# Patient Record
Sex: Male | Born: 1950 | Race: White | Hispanic: No | Marital: Married | Smoking: Never smoker
Health system: Southern US, Community
[De-identification: ages and names within clinical notes are randomized; demographics above are authoritative.]

## PROBLEM LIST (undated history)

## (undated) DIAGNOSIS — I10 Essential (primary) hypertension: Secondary | ICD-10-CM

## (undated) HISTORY — PX: HERNIA REPAIR: SHX51

---

## 2021-02-06 ENCOUNTER — Emergency Department (HOSPITAL_BASED_OUTPATIENT_CLINIC_OR_DEPARTMENT_OTHER)
Admission: EM | Admit: 2021-02-06 | Discharge: 2021-02-07 | Disposition: A | Discharge status: Home / Self Care | Payer: Medicare Other | Attending: Emergency Medicine | Admitting: Emergency Medicine

## 2021-02-06 ENCOUNTER — Other Ambulatory Visit: Payer: Self-pay

## 2021-02-06 ENCOUNTER — Encounter (HOSPITAL_BASED_OUTPATIENT_CLINIC_OR_DEPARTMENT_OTHER): Payer: Self-pay | Admitting: Emergency Medicine

## 2021-02-06 ENCOUNTER — Emergency Department (HOSPITAL_BASED_OUTPATIENT_CLINIC_OR_DEPARTMENT_OTHER): Payer: Medicare Other

## 2021-02-06 DIAGNOSIS — D649 Anemia, unspecified: Secondary | ICD-10-CM | POA: Diagnosis not present

## 2021-02-06 DIAGNOSIS — Z87891 Personal history of nicotine dependence: Secondary | ICD-10-CM | POA: Diagnosis not present

## 2021-02-06 DIAGNOSIS — E876 Hypokalemia: Secondary | ICD-10-CM

## 2021-02-06 DIAGNOSIS — U071 COVID-19: Secondary | ICD-10-CM | POA: Diagnosis not present

## 2021-02-06 DIAGNOSIS — D72821 Monocytosis (symptomatic): Secondary | ICD-10-CM | POA: Diagnosis not present

## 2021-02-06 DIAGNOSIS — R7401 Elevation of levels of liver transaminase levels: Secondary | ICD-10-CM

## 2021-02-06 DIAGNOSIS — R0602 Shortness of breath: Secondary | ICD-10-CM | POA: Diagnosis present

## 2021-02-06 DIAGNOSIS — R030 Elevated blood-pressure reading, without diagnosis of hypertension: Secondary | ICD-10-CM

## 2021-02-06 HISTORY — DX: Essential (primary) hypertension: I10

## 2021-02-06 LAB — RESP PANEL BY RT-PCR (FLU A&B, COVID) ARPGX2
Influenza A by PCR: NEGATIVE
Influenza B by PCR: NEGATIVE
SARS Coronavirus 2 by RT PCR: POSITIVE — AB

## 2021-02-06 MED ORDER — LACTATED RINGERS IV BOLUS
1000.0000 mL | Freq: Once | INTRAVENOUS | Status: AC
  Administered 2021-02-07: 1000 mL via INTRAVENOUS

## 2021-02-06 NOTE — ED Triage Notes (Signed)
Pt states started having Sx last weekend, tested positive for covid Thur. Has had weakness, fatigue and sob since. Felt better yesterday then got worse this morning.

## 2021-02-06 NOTE — ED Provider Notes (Signed)
[facility] EMERGENCY DEPARTMENT Provider Note   CSN: [id number] Arrival date & time: 02/06/21  1936     History Chief Complaint  Patient presents with   Shortness of Breath   Weakness    Alec Hill is a 70 y.o. male.  The history is provided by the patient.  Shortness of Breath Weakness Associated symptoms: shortness of breath   He has had a respiratory infection with positive test for COVID at home.  He started getting sick about 9 days ago with sore throat and a slight cough.  He has not had any fever, chills, sweats.  He is feeling generally weak.  He had a positive COVID test 4 days ago.  There has been some mild aching in his neck but no other arthralgias or myalgias.  He denies any dyspnea and there has been no nausea, vomiting, diarrhea.  He has had anorexia and is concerned that he might be dehydrated.  Of note, he is fully vaccinated and has had 2 booster doses as well.   History reviewed. No pertinent past medical history.  There are no problems to display for this patient.   Past Surgical History:  Procedure Laterality Date   HERNIA REPAIR         History reviewed. No pertinent family history.  Social History   Tobacco Use   Smoking status: Never   Smokeless tobacco: Former  Building services engineer Use: Never used  Substance Use Topics   Alcohol use: Not Currently   Drug use: Never    Home Medications Prior to Admission medications   Not on File    Allergies    Patient has no allergy information on record.  Review of Systems   Review of Systems  Respiratory:  Positive for shortness of breath.   Neurological:  Positive for weakness.  All other systems reviewed and are negative.  Physical Exam Updated Vital Signs BP (!) 129/96 (BP Location: Left Arm)   Pulse 86   Temp 97.8 F (36.6 C) (Oral)   Resp (!) 22   Ht 5\' 11"  (1.803 m)   Wt 108.9 kg   SpO2 94%   BMI 33.47 kg/m   Physical Exam Vitals and nursing note reviewed.   70 year old male, resting comfortably and in no acute distress. Vital signs are significant for borderline elevated blood pressure and slightly elevated respiratory rate. Oxygen saturation is 94%, which is normal. Head is normocephalic and atraumatic. PERRLA, EOMI. Oropharynx is clear. Neck is nontender and supple without adenopathy or JVD. Back is nontender and there is no CVA tenderness. Lungs are clear without rales, wheezes, or rhonchi. Chest is nontender. Heart has regular rate and rhythm without murmur. Abdomen is soft, flat, nontender without masses or hepatosplenomegaly and peristalsis is normoactive. Extremities have no cyanosis or edema, full range of motion is present. Skin is warm and dry without rash. Neurologic: Mental status is normal, cranial nerves are intact, there are no motor or sensory deficits.  ED Results / Procedures / Treatments   Labs (all labs ordered are listed, but only abnormal results are displayed) Labs Reviewed  RESP PANEL BY RT-PCR (FLU A&B, COVID) ARPGX2 - Abnormal; Notable for the following components:      Result Value   SARS Coronavirus 2 by RT PCR POSITIVE (*)    All other components within normal limits  CBC WITH DIFFERENTIAL/PLATELET - Abnormal; Notable for the following components:   WBC 70.0 (*)    RBC  3.29 (*)    Hemoglobin 10.7 (*)    HCT 31.9 (*)    RDW 17.1 (*)    nRBC 3.0 (*)    Neutro Abs 12.0 (*)    Lymphs Abs 11.1 (*)    Monocytes Absolute 41.3 (*)    Basophils Absolute 0.5 (*)    Abs Immature Granulocytes 4.70 (*)    All other components within normal limits  COMPREHENSIVE METABOLIC PANEL - Abnormal; Notable for the following components:   Potassium 3.2 (*)    Glucose, Bld 131 (*)    Creatinine, Ser 1.29 (*)    AST 56 (*)    All other components within normal limits  PATHOLOGIST SMEAR REVIEW   Radiology DG Chest 2 View  Result Date: 02/06/2021 CLINICAL DATA:  Cough, shortness of breath EXAM: CHEST - 2 VIEW COMPARISON:   01/09/2021 FINDINGS: The heart size and mediastinal contours are within normal limits. Unchanged elevation of the left hemidiaphragm. No acute appearing airspace opacity. The visualized skeletal structures are unremarkable. IMPRESSION: No acute abnormality of the lungs. Unchanged elevation of the left hemidiaphragm. Electronically Signed   By: Lauralyn Primes M.D.   On: 02/06/2021 20:21    Procedures Procedures   Medications Ordered in ED Medications  lactated ringers bolus 1,000 mL (1,000 mLs Intravenous New Bag/Given 02/07/21 0012)  potassium chloride SA (KLOR-CON) CR tablet 40 mEq (40 mEq Oral Given 02/07/21 0104)    ED Course  I have reviewed the triage vital signs and the nursing notes.  Pertinent labs & imaging results that were available during my care of the patient were reviewed by me and considered in my medical decision making (see chart for details).   MDM Rules/Calculators/A&P                         COVID-19 infection.  Patient does not have significant underlying illness that would require treatment with antiviral.  Also, he is outside of the window for treatment with antiviral medication.  Chest x-ray shows no evidence of pneumonia.  Respiratory pathogen panel is positive for COVID-19.  We will check screening labs and he is given IV fluids.  Old records reviewed, and he has no relevant past visits.  He feels somewhat better after IV fluids.  Labs show marked leukocytosis of 70,000 which is predominantly monocytes.  Anemia is also noted to be present but platelet count is normal.  On care everywhere, it is noted that he had a CBC on 01/09/2021 at which time WBC was 6.1 with normal differential.  Given very recent normal WBC and normal platelet count, I suspect that this is a leukemoid reaction secondary to COVID-19.  However, possibility of acute leukemia needs to be considered.  He is referred to oncology for outpatient work-up.  Labs also show mild hypokalemia and is given a dose of  oral potassium and discharged with prescription for potassium.  Anemia is slightly worse than it was 1 month ago, this will need to be followed as an outpatient.  Recommended outpatient blood pressure rechecks.  Final Clinical Impression(s) / ED Diagnoses Final diagnoses:  COVID-19 virus infection  Monocytosis  Normochromic normocytic anemia  Hypokalemia  Elevated blood pressure reading without diagnosis of hypertension  Elevated AST (SGOT)    Rx / DC Orders ED Discharge Orders          Ordered    Ambulatory referral to Hematology / Oncology        02/07/21 0120  potassium chloride SA (KLOR-CON) 20 MEQ tablet  2 times daily        02/07/21 0120             Dione Booze, MD 02/07/21 [phone]

## 2021-02-07 ENCOUNTER — Encounter (HOSPITAL_BASED_OUTPATIENT_CLINIC_OR_DEPARTMENT_OTHER): Payer: Self-pay | Admitting: Emergency Medicine

## 2021-02-07 ENCOUNTER — Encounter: Payer: Self-pay | Admitting: *Deleted

## 2021-02-07 LAB — CBC WITH DIFFERENTIAL/PLATELET
Abs Immature Granulocytes: 4.7 10*3/uL — ABNORMAL HIGH (ref 0.00–0.07)
Basophils Absolute: 0.5 10*3/uL — ABNORMAL HIGH (ref 0.0–0.1)
Basophils Relative: 1 %
Eosinophils Absolute: 0.5 10*3/uL (ref 0.0–0.5)
Eosinophils Relative: 1 %
HCT: 31.9 % — ABNORMAL LOW (ref 39.0–52.0)
Hemoglobin: 10.7 g/dL — ABNORMAL LOW (ref 13.0–17.0)
Immature Granulocytes: 7 %
Lymphocytes Relative: 16 %
Lymphs Abs: 11.1 10*3/uL — ABNORMAL HIGH (ref 0.7–4.0)
MCH: 32.5 pg (ref 26.0–34.0)
MCHC: 33.5 g/dL (ref 30.0–36.0)
MCV: 97 fL (ref 80.0–100.0)
Monocytes Absolute: 41.3 10*3/uL — ABNORMAL HIGH (ref 0.1–1.0)
Monocytes Relative: 58 %
Neutro Abs: 12 10*3/uL — ABNORMAL HIGH (ref 1.7–7.7)
Neutrophils Relative %: 17 %
Platelets: 183 10*3/uL (ref 150–400)
RBC: 3.29 MIL/uL — ABNORMAL LOW (ref 4.22–5.81)
RDW: 17.1 % — ABNORMAL HIGH (ref 11.5–15.5)
Smear Review: ADEQUATE
WBC: 70 10*3/uL (ref 4.0–10.5)
nRBC: 3 % — ABNORMAL HIGH (ref 0.0–0.2)

## 2021-02-07 LAB — COMPREHENSIVE METABOLIC PANEL
ALT: 39 U/L (ref 0–44)
AST: 56 U/L — ABNORMAL HIGH (ref 15–41)
Albumin: 4.2 g/dL (ref 3.5–5.0)
Alkaline Phosphatase: 93 U/L (ref 38–126)
Anion gap: 10 (ref 5–15)
BUN: 14 mg/dL (ref 8–23)
CO2: 26 mmol/L (ref 22–32)
Calcium: 9.4 mg/dL (ref 8.9–10.3)
Chloride: 107 mmol/L (ref 98–111)
Creatinine, Ser: 1.29 mg/dL — ABNORMAL HIGH (ref 0.61–1.24)
GFR, Estimated: >60 mL/min (ref >60)
Glucose, Bld: 131 mg/dL — ABNORMAL HIGH (ref 70–99)
Potassium: 3.2 mmol/L — ABNORMAL LOW (ref 3.5–5.1)
Sodium: 143 mmol/L (ref 135–145)
Total Bilirubin: 1.1 mg/dL (ref 0.3–1.2)
Total Protein: 6.7 g/dL (ref 6.5–8.1)

## 2021-02-07 MED ORDER — POTASSIUM CHLORIDE CRYS ER 20 MEQ PO TBCR
40.0000 meq | EXTENDED_RELEASE_TABLET | Freq: Once | ORAL | Status: AC
  Administered 2021-02-07: 40 meq via ORAL
  Filled 2021-02-07: qty 2

## 2021-02-07 MED ORDER — POTASSIUM CHLORIDE CRYS ER 20 MEQ PO TBCR: 20.0000 meq | EXTENDED_RELEASE_TABLET | Freq: Two times a day (BID) | ORAL | 0 refills | Status: DC

## 2021-02-07 NOTE — Progress Notes (Signed)
Reached out to Cardell Peach to introduce myself as the office RN Navigator and explain our new patient process. Reviewed the reason for their referral and scheduled their new patient appointment along with labs. Provided address and directions to the office including call back phone number. Reviewed with patient any concerns they may have or any possible barriers to attending their appointment.   Patient was scheduled for 02/17/2021 however patient still feels poorly with active COVID infection and he would like the appointment pushed back several weeks. Patient rescheduled for 03/01/2021.   Welcome packet mailed to patient home including calendar, welcome letter, map and navigator business card.   Oncology Nurse Navigator Documentation  Oncology Nurse Navigator Flowsheets 02/07/2021  Abnormal Finding Date 02/06/2021  Diagnosis Status Additional Work Up  Navigator Follow Up Date: 03/01/2021  Navigator Follow Up Reason: New Patient Appointment  Navigator Location [facility]  Referral Date to RadOnc/MedOnc 02/06/2021  Navigator Encounter Type Introductory Phone Call  Patient Visit Type MedOnc  Treatment Phase Abnormal Labs  Barriers/Navigation Needs Coordination of Care;Education  Education Other  Interventions Coordination of Care;Education  Acuity Level 2-Minimal Needs (1-2 Barriers Identified)  Coordination of Care Appts  Education Method Verbal;Written  Time Spent with Patient 45

## 2021-02-07 NOTE — Discharge Instructions (Addendum)
Please make sure to drink plenty of fluids and try to eat a balanced diet.  Your laboratory work-up showed a very high white blood cell count, specifically with a type of cell called monocytes.  This is probably a reaction to your COVID-19 infection, but could represent a type of leukemia.  Please follow-up with the oncologist for further outpatient work-up.  Your blood pressure was elevated today.  Please have it rechecked several times over the next 2weeks.  If it stays elevated, you may need to be on medication to control it.  Inadequately controlled high blood pressure can lead to heart attacks, strokes, kidney failure.

## 2021-02-08 LAB — PATHOLOGIST SMEAR REVIEW

## 2021-02-17 ENCOUNTER — Other Ambulatory Visit: Payer: Medicare Other

## 2021-02-17 ENCOUNTER — Ambulatory Visit: Payer: Medicare Other | Admitting: Hematology & Oncology

## 2021-03-01 ENCOUNTER — Ambulatory Visit: Payer: Medicare Other | Admitting: Hematology & Oncology

## 2021-03-01 ENCOUNTER — Encounter: Payer: Self-pay | Admitting: *Deleted

## 2021-03-01 ENCOUNTER — Other Ambulatory Visit: Payer: Medicare Other

## 2021-03-01 NOTE — Progress Notes (Signed)
When patient chart reviewed for today's new patient appointment, it was found that the patient is currently admitted to [facility]. 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   Called and spoke to patient. Confirmed that he was admitted and receiving treatment. He will continue care with the [facility] physician team.   Will cancel today's appointments, and close referral. Dr Myna Hidalgo notified.   Oncology Nurse Navigator Documentation  Oncology Nurse Navigator Flowsheets 03/01/2021  Abnormal Finding Date -  Diagnosis Status -  Navigator Follow Up Date: -  Navigator Follow Up Reason: -  Navigation Complete Date: 03/01/2021  Post Navigation: Continue to Follow Patient? No  Reason Not Navigating Patient: Seeking Care elsewhere  Navigator Location [facility]  Referral Date to RadOnc/MedOnc -  Navigator Encounter Type Appt/Treatment Plan Review;Telephone  Telephone Outgoing Call  Patient Visit Type MedOnc  Treatment Phase Abnormal Labs  Barriers/Navigation Needs Coordination of Care;Education  Education -  Interventions Psycho-Social Support  Acuity Level 2-Minimal Needs (1-2 Barriers Identified)  Coordination of Care -  Education Method -  Time Spent with Patient 30

## 2022-09-10 DEATH — deceased

## 2023-03-09 IMAGING — DX DG CHEST 2V
2 series · 2 of 2 positions shown · non-contrast
Comparison: 01/09/2021

CLINICAL DATA: Cough, shortness of breath

EXAM:
CHEST - 2 VIEW

[chest pa]
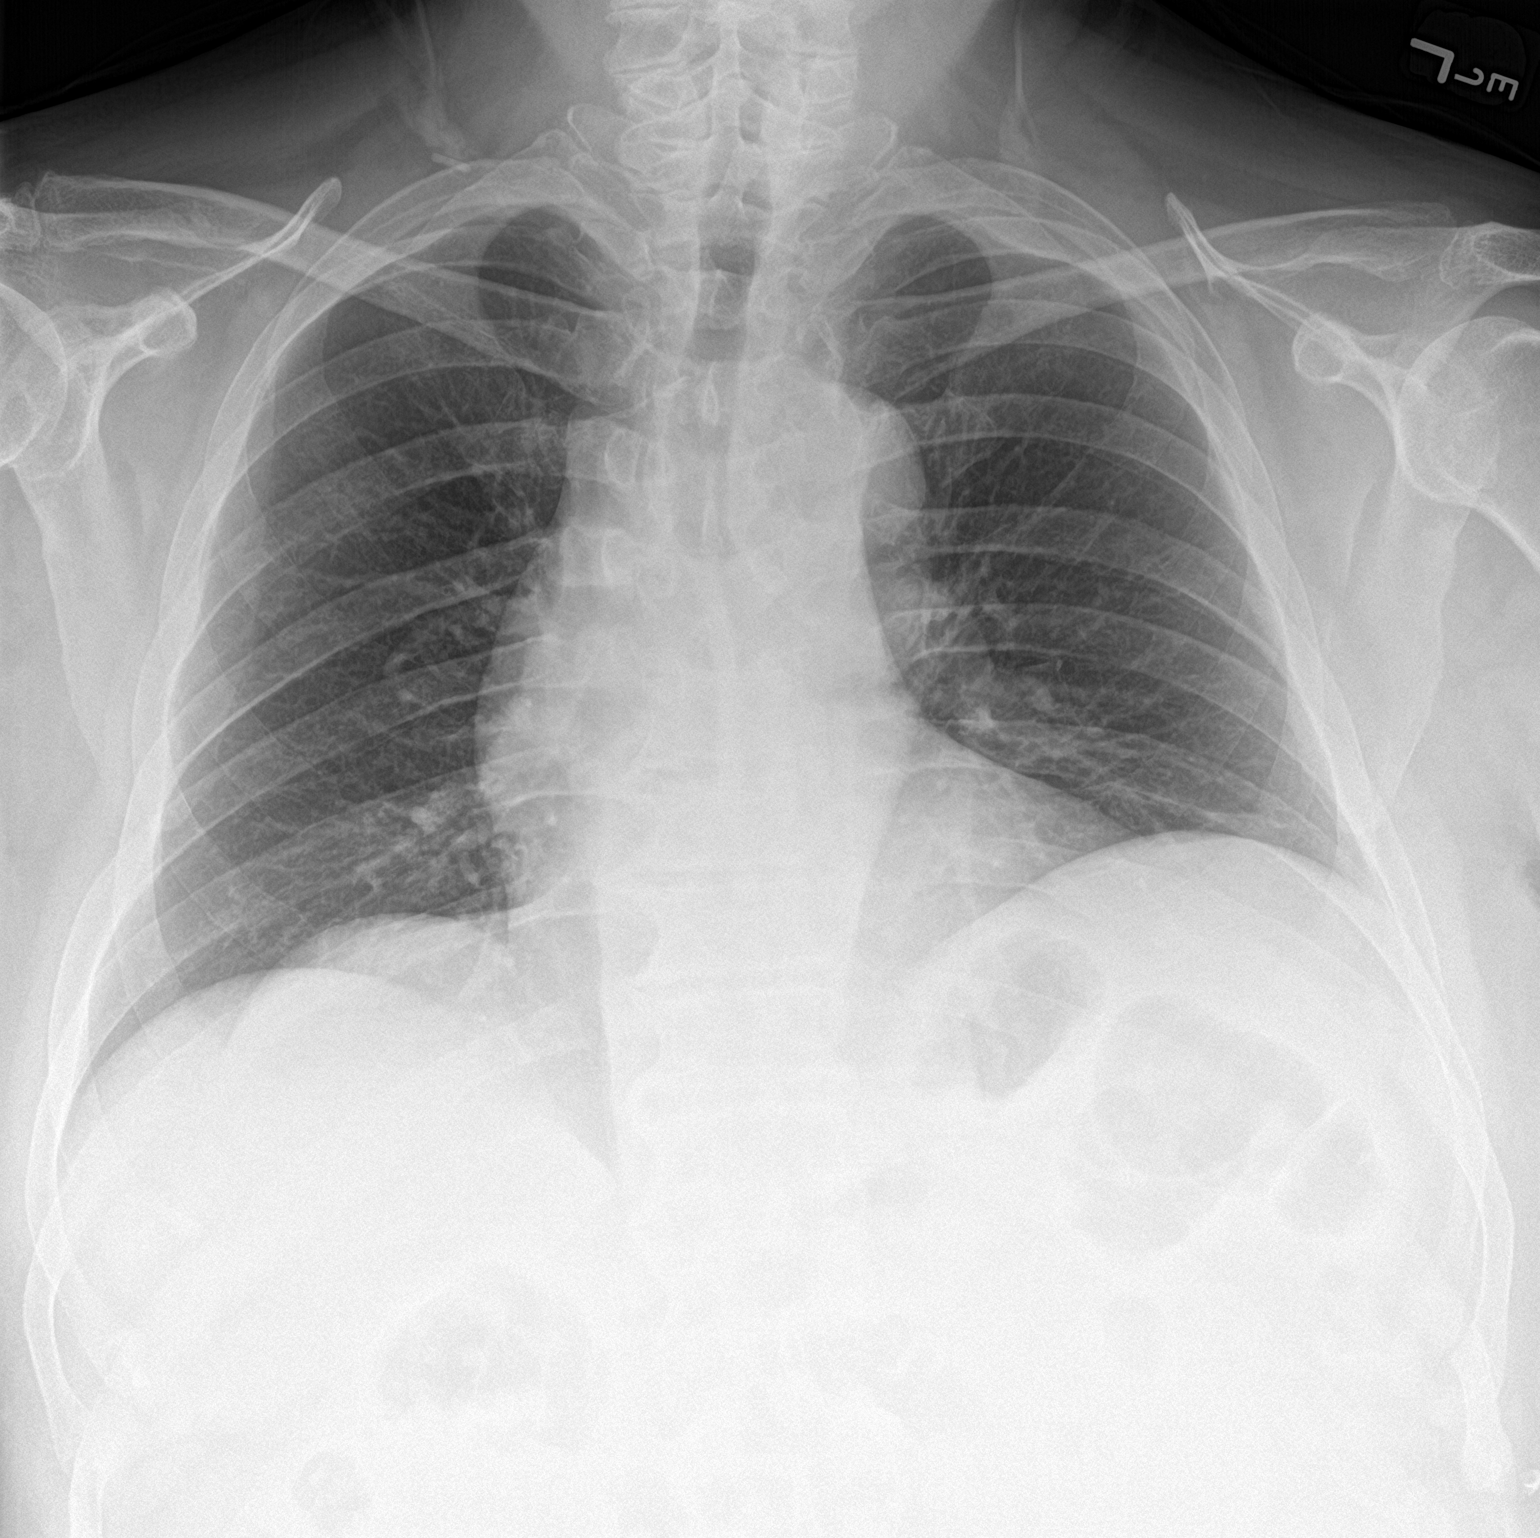

[chest lat]
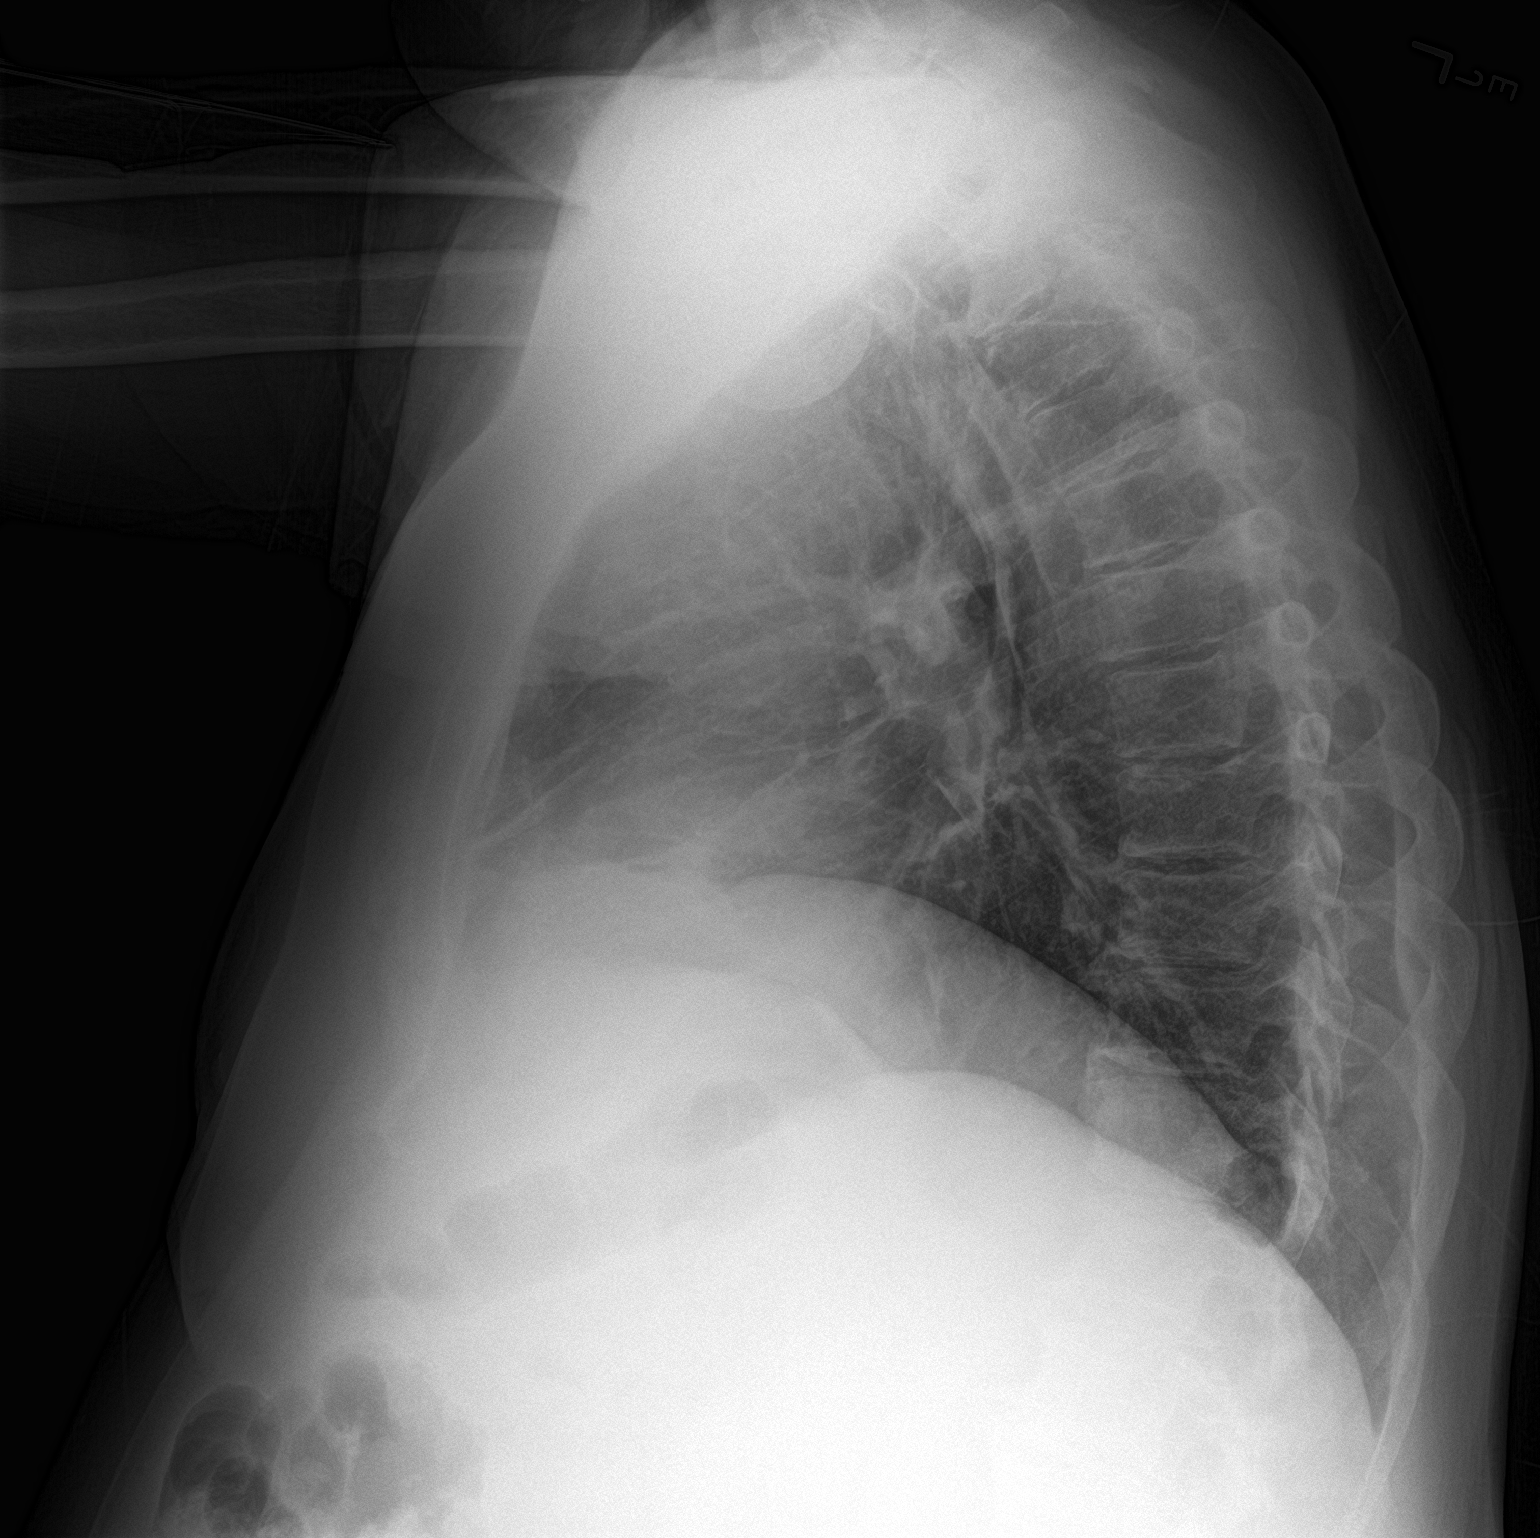

[2 of 2 positions shown; findings below may reference images not displayed]

FINDINGS: The heart size and mediastinal contours are within normal limits.
Unchanged elevation of the left hemidiaphragm. No acute appearing
airspace opacity. The visualized skeletal structures are
unremarkable.
IMPRESSION: No acute abnormality of the lungs. Unchanged elevation of the left
hemidiaphragm.
